# Patient Record
Sex: Female | Born: 1965
Health system: Southern US, Community
[De-identification: ages and names within clinical notes are randomized; demographics above are authoritative.]

## PROBLEM LIST (undated history)

## (undated) HISTORY — PX: TONSILLECTOMY: SUR1361

---

## 2017-11-25 ENCOUNTER — Emergency Department (HOSPITAL_BASED_OUTPATIENT_CLINIC_OR_DEPARTMENT_OTHER): Payer: 59

## 2017-11-25 ENCOUNTER — Encounter (HOSPITAL_BASED_OUTPATIENT_CLINIC_OR_DEPARTMENT_OTHER): Payer: Self-pay | Admitting: Emergency Medicine

## 2017-11-25 ENCOUNTER — Emergency Department (HOSPITAL_BASED_OUTPATIENT_CLINIC_OR_DEPARTMENT_OTHER)
Admission: EM | Admit: 2017-11-25 | Discharge: 2017-11-26 | Disposition: A | Discharge status: Home / Self Care | Payer: 59 | Attending: Emergency Medicine | Admitting: Emergency Medicine

## 2017-11-25 ENCOUNTER — Other Ambulatory Visit: Payer: Self-pay

## 2017-11-25 DIAGNOSIS — D259 Leiomyoma of uterus, unspecified: Secondary | ICD-10-CM | POA: Insufficient documentation

## 2017-11-25 DIAGNOSIS — Z91040 Latex allergy status: Secondary | ICD-10-CM | POA: Insufficient documentation

## 2017-11-25 DIAGNOSIS — R1010 Upper abdominal pain, unspecified: Secondary | ICD-10-CM | POA: Diagnosis not present

## 2017-11-25 DIAGNOSIS — K5901 Slow transit constipation: Secondary | ICD-10-CM

## 2017-11-25 DIAGNOSIS — R1031 Right lower quadrant pain: Secondary | ICD-10-CM | POA: Diagnosis not present

## 2017-11-25 DIAGNOSIS — R1084 Generalized abdominal pain: Secondary | ICD-10-CM | POA: Diagnosis present

## 2017-11-25 DIAGNOSIS — D219 Benign neoplasm of connective and other soft tissue, unspecified: Secondary | ICD-10-CM

## 2017-11-25 LAB — CBC WITH DIFFERENTIAL/PLATELET
BASOS ABS: 0 10*3/uL (ref 0.0–0.1)
Basophils Relative: 1 %
Eosinophils Absolute: 0 10*3/uL (ref 0.0–0.7)
Eosinophils Relative: 1 %
HEMATOCRIT: 43.2 % (ref 36.0–46.0)
Hemoglobin: 14.6 g/dL (ref 12.0–15.0)
LYMPHS ABS: 1.7 10*3/uL (ref 0.7–4.0)
Lymphocytes Relative: 38 %
MCH: 32.4 pg (ref 26.0–34.0)
MCHC: 33.8 g/dL (ref 30.0–36.0)
MCV: 95.8 fL (ref 78.0–100.0)
Monocytes Absolute: 0.9 10*3/uL (ref 0.1–1.0)
Monocytes Relative: 19 %
NEUTROS ABS: 1.8 10*3/uL (ref 1.7–7.7)
Neutrophils Relative %: 41 %
Platelets: 166 10*3/uL (ref 150–400)
RBC: 4.51 MIL/uL (ref 3.87–5.11)
RDW: 12.6 % (ref 11.5–15.5)
WBC: 4.4 10*3/uL (ref 4.0–10.5)

## 2017-11-25 LAB — URINALYSIS, ROUTINE W REFLEX MICROSCOPIC
BILIRUBIN URINE: NEGATIVE
Glucose, UA: NEGATIVE mg/dL
HGB URINE DIPSTICK: NEGATIVE
KETONES UR: NEGATIVE mg/dL
Leukocytes, UA: NEGATIVE
Nitrite: NEGATIVE
PROTEIN: NEGATIVE mg/dL
Specific Gravity, Urine: <1.005 — ABNORMAL LOW (ref 1.005–1.030)
pH: 7 (ref 5.0–8.0)

## 2017-11-25 LAB — COMPREHENSIVE METABOLIC PANEL
ALK PHOS: 49 U/L (ref 38–126)
ALT: 23 U/L (ref 0–44)
AST: 27 U/L (ref 15–41)
Albumin: 3.6 g/dL (ref 3.5–5.0)
Anion gap: 11 (ref 5–15)
BILIRUBIN TOTAL: 0.5 mg/dL (ref 0.3–1.2)
BUN: 19 mg/dL (ref 6–20)
CALCIUM: 9.1 mg/dL (ref 8.9–10.3)
CO2: 30 mmol/L (ref 22–32)
CREATININE: 0.91 mg/dL (ref 0.44–1.00)
Chloride: 100 mmol/L (ref 98–111)
GFR calc Af Amer: >60 mL/min (ref >60)
GFR calc non Af Amer: >60 mL/min (ref >60)
Glucose, Bld: 104 mg/dL — ABNORMAL HIGH (ref 70–99)
Potassium: 3.6 mmol/L (ref 3.5–5.1)
Sodium: 141 mmol/L (ref 135–145)
TOTAL PROTEIN: 6.2 g/dL — AB (ref 6.5–8.1)

## 2017-11-25 LAB — LIPASE, BLOOD: LIPASE: 43 U/L (ref 11–51)

## 2017-11-25 MED ORDER — ONDANSETRON HCL 4 MG/2ML IJ SOLN
4.0000 mg | Freq: Once | INTRAMUSCULAR | Status: AC
  Administered 2017-11-25: 4 mg via INTRAVENOUS
  Filled 2017-11-25: qty 2

## 2017-11-25 NOTE — ED Triage Notes (Signed)
PT presents with c/o abdominal pain and fever since Thursday. PT did e visit and was told to come to ER e visit assessment showed RLQ pain, pt reports nausea.

## 2017-11-25 NOTE — ED Provider Notes (Signed)
[facility] EMERGENCY DEPARTMENT Provider Note   CSN: [id number] Arrival date & time: 11/25/17  1832     History   Chief Complaint Chief Complaint  Patient presents with  . Abdominal Pain    HPI Joyce Austin is a 52 y.o. female.  HPI   Joyce Austin is a 52 year old female with no significant past medical history who presents to the emergency department for evaluation of generalized abdominal pain, fevers, bloating, nausea.  She reports her symptoms started 3 days ago.  States that her abdomen feels generally tight.  Pain is about a 4/10 in severity and constant.  Seems to be worsened with eating any kind of food.  She has been belching and passing more gas recently.  Took some ibuprofen which did not seem to help her symptoms much.  She also has had intermittent fevers at home measuring up to 101.54F yesterday evening.  She has had nausea, no vomiting.  Feels as if her abdomen is bloated.  She has had a prior C-section, no other abdominal surgeries.  She is a marathon runner, has had a 4 pounds of unintentional weight loss over the past 2 months, but believes this is related to running longer distances.  She has night sweats, but attributes this to menopause.  She denies dysuria, urinary frequency, hematuria, flank pain, back pain, diarrhea, vaginal bleeding, vaginal discharge, constipation, chest pain, shortness of breath, lightheadedness or syncope.  No close contacts with similar symptoms.  Last bowel movement was earlier today and normal.  History reviewed. No pertinent past medical history.  There are no active problems to display for this patient.   Past Surgical History:  Procedure Laterality Date  . CESAREAN SECTION    . TONSILLECTOMY       OB History   None      Home Medications    Prior to Admission medications   Not on File    Family History No family history on file.  Social History Social History   Tobacco Use  . Smoking status: Never Smoker    . Smokeless tobacco: Never Used  Substance Use Topics  . Alcohol use: Never    Frequency: Never  . Drug use: Never     Allergies   Dairy aid [lactase]; Latex; and Ultram [tramadol hcl]   Review of Systems Review of Systems  Constitutional: Positive for chills, fever and unexpected weight change.  Eyes: Negative for visual disturbance.  Respiratory: Negative for shortness of breath.   Cardiovascular: Negative for chest pain.  Gastrointestinal: Positive for abdominal pain. Negative for abdominal distention, blood in stool, constipation, diarrhea, nausea and vomiting.  Genitourinary: Negative for difficulty urinating, dysuria, flank pain, frequency, hematuria, vaginal bleeding and vaginal discharge.  Musculoskeletal: Negative for back pain and gait problem.  Skin: Negative for rash.  Neurological: Negative for weakness, light-headedness and numbness.  Psychiatric/Behavioral: Negative for agitation.     Physical Exam Updated Vital Signs BP 115/71 (BP Location: Right Arm)   Pulse 63   Temp 98.7 F (37.1 C) (Oral)   Resp 16   Ht 5\' 1"  (1.549 m)   Wt 44.9 kg   SpO2 100%   BMI 18.71 kg/m   Physical Exam  Constitutional: She is oriented to person, place, and time. She appears well-developed and well-nourished. No distress.  No acute distress, nontoxic-appearing.  HENT:  Head: Normocephalic and atraumatic.  Mouth/Throat: Oropharynx is clear and moist.  Eyes: Pupils are equal, round, and reactive to light. Conjunctivae are normal. Right  eye exhibits no discharge. Left eye exhibits no discharge.  Neck: Normal range of motion. Neck supple.  Cardiovascular: Normal rate, regular rhythm and intact distal pulses.  No murmur heard. Pulmonary/Chest: Effort normal and breath sounds normal. No stridor. No respiratory distress. She has no wheezes. She has no rales.  Abdominal:  Abdomen soft and nondistended.  Acutely tender to palpation in the right lower quadrant as well as in the  left lower quadrant.  No guarding or rigidity.  Negative Murphy sign.  No CVA tenderness.  Musculoskeletal: Normal range of motion.  Neurological: She is alert and oriented to person, place, and time. Coordination normal.  Skin: Skin is warm and dry. Capillary refill takes less than 2 seconds. She is not diaphoretic.  Psychiatric: She has a normal mood and affect. Her behavior is normal.  Nursing note and vitals reviewed.    ED Treatments / Results  Labs (all labs ordered are listed, but only abnormal results are displayed) Labs Reviewed  COMPREHENSIVE METABOLIC PANEL - Abnormal; Notable for the following components:      Result Value   Glucose, Bld 104 (*)    Total Protein 6.2 (*)    All other components within normal limits  URINALYSIS, ROUTINE W REFLEX MICROSCOPIC - Abnormal; Notable for the following components:   Specific Gravity, Urine <1.005 (*)    All other components within normal limits  CBC WITH DIFFERENTIAL/PLATELET  LIPASE, BLOOD    EKG None  Radiology No results found.  Procedures Procedures (including critical care time)  Medications Ordered in ED Medications  ondansetron (ZOFRAN) injection 4 mg (4 mg Intravenous Given 11/25/17 2209)     Initial Impression / Assessment and Plan / ED Course  I have reviewed the triage vital signs and the nursing notes.  Pertinent labs & imaging results that were available during my care of the patient were reviewed by me and considered in my medical decision making (see chart for details).    Patient presents with generalized abdominal pain, nausea, bloating, intermittent fevers over the past few days.  On exam she is afebrile and nontoxic-appearing.  Acutely tender to palpation in both the right and left lower quadrants.  No peritoneal signs and I have no concern for acute surgical abdomen.  Lab work reviewed.  No leukocytosis.  CMP without any major lecture light abnormalities, creatinine within normal and liver enzymes  within normal.  UA without evidence of infection.  Lipase negative.  Overall lab work is reassuring, but patient does report measured temperature and has significant tenderness on exam.  Will get CT scan abdomen/pelvis for further evaluation.  Signout given at shift change to Dr. Christy Gentles for disposition once CT returns.  Final Clinical Impressions(s) / ED Diagnoses   Final diagnoses:  None    ED Discharge Orders    None       Bernarda Caffey 11/26/17 0014    Ripley Fraise, MD 11/26/17 [phone]

## 2017-11-26 DIAGNOSIS — R1031 Right lower quadrant pain: Secondary | ICD-10-CM | POA: Diagnosis not present

## 2017-11-26 MED ORDER — ONDANSETRON 8 MG PO TBDP: ORAL_TABLET | ORAL | 0 refills | Status: DC

## 2017-11-26 MED ORDER — IOPAMIDOL (ISOVUE-300) INJECTION 61%
100.0000 mL | Freq: Once | INTRAVENOUS | Status: AC | PRN
  Administered 2017-11-26: 100 mL via INTRAVENOUS

## 2017-11-26 NOTE — ED Provider Notes (Signed)
CT results discussed with patient.  She will start Colace at home.  Prescribed Zofran for nausea.  She was informed of fibroids, and likely need for outpatient imaging.  She will follow-up with her new PCP about this.  She feels comfortable for discharge, advised her she should be able to run a marathon next week   Ripley Fraise, MD 11/26/17 [phone]

## 2017-11-26 NOTE — ED Notes (Signed)
Pt and family understood dc material. NAD noted. Scripts sent electronically

## 2017-11-26 NOTE — ED Notes (Signed)
Patient transported to CT 

## 2018-01-15 DIAGNOSIS — M79604 Pain in right leg: Secondary | ICD-10-CM | POA: Diagnosis not present

## 2018-01-22 DIAGNOSIS — M79604 Pain in right leg: Secondary | ICD-10-CM | POA: Diagnosis not present

## 2018-05-16 ENCOUNTER — Other Ambulatory Visit (HOSPITAL_COMMUNITY)
Admission: RE | Admit: 2018-05-16 | Discharge: 2018-05-16 | Disposition: A | Discharge status: Home / Self Care | Payer: 59 | Source: Ambulatory Visit | Attending: Family Medicine | Admitting: Family Medicine

## 2018-05-16 ENCOUNTER — Other Ambulatory Visit: Payer: Self-pay | Admitting: Family Medicine

## 2018-05-16 DIAGNOSIS — Z1159 Encounter for screening for other viral diseases: Secondary | ICD-10-CM | POA: Diagnosis not present

## 2018-05-16 DIAGNOSIS — Z124 Encounter for screening for malignant neoplasm of cervix: Secondary | ICD-10-CM | POA: Insufficient documentation

## 2018-05-16 DIAGNOSIS — Z0000 Encounter for general adult medical examination without abnormal findings: Secondary | ICD-10-CM | POA: Diagnosis not present

## 2018-05-16 DIAGNOSIS — Z13220 Encounter for screening for lipoid disorders: Secondary | ICD-10-CM | POA: Diagnosis not present

## 2018-05-18 LAB — CYTOLOGY - PAP
Diagnosis: NEGATIVE
HPV (WINDOPATH): NOT DETECTED

## 2018-05-21 ENCOUNTER — Other Ambulatory Visit: Payer: Self-pay | Admitting: Family Medicine

## 2018-05-21 DIAGNOSIS — Z1231 Encounter for screening mammogram for malignant neoplasm of breast: Secondary | ICD-10-CM

## 2018-06-21 ENCOUNTER — Ambulatory Visit: Payer: 59

## 2018-07-18 ENCOUNTER — Ambulatory Visit: Payer: 59

## 2018-10-04 ENCOUNTER — Ambulatory Visit
Admission: RE | Admit: 2018-10-04 | Discharge: 2018-10-04 | Disposition: A | Discharge status: Home / Self Care | Payer: 59 | Source: Ambulatory Visit | Attending: Family Medicine | Admitting: Family Medicine

## 2018-10-04 DIAGNOSIS — Z1231 Encounter for screening mammogram for malignant neoplasm of breast: Secondary | ICD-10-CM

## 2019-06-04 ENCOUNTER — Other Ambulatory Visit: Payer: Self-pay | Admitting: Family Medicine

## 2019-06-04 DIAGNOSIS — Z1231 Encounter for screening mammogram for malignant neoplasm of breast: Secondary | ICD-10-CM

## 2019-06-04 DIAGNOSIS — E2839 Other primary ovarian failure: Secondary | ICD-10-CM

## 2019-10-07 ENCOUNTER — Ambulatory Visit
Admission: RE | Admit: 2019-10-07 | Discharge: 2019-10-07 | Disposition: A | Discharge status: Home / Self Care | Payer: 59 | Source: Ambulatory Visit | Attending: Family Medicine | Admitting: Family Medicine

## 2019-10-07 ENCOUNTER — Other Ambulatory Visit: Payer: Self-pay

## 2019-10-07 DIAGNOSIS — Z1231 Encounter for screening mammogram for malignant neoplasm of breast: Secondary | ICD-10-CM

## 2019-10-07 DIAGNOSIS — E2839 Other primary ovarian failure: Secondary | ICD-10-CM

## 2020-08-28 ENCOUNTER — Other Ambulatory Visit: Payer: Self-pay | Admitting: Family Medicine

## 2020-08-28 DIAGNOSIS — Z1231 Encounter for screening mammogram for malignant neoplasm of breast: Secondary | ICD-10-CM

## 2020-09-15 ENCOUNTER — Other Ambulatory Visit: Payer: Self-pay

## 2020-09-15 ENCOUNTER — Ambulatory Visit
Admission: RE | Admit: 2020-09-15 | Discharge: 2020-09-15 | Disposition: A | Discharge status: Home / Self Care | Payer: 59 | Source: Ambulatory Visit

## 2020-09-15 DIAGNOSIS — Z1231 Encounter for screening mammogram for malignant neoplasm of breast: Secondary | ICD-10-CM

## 2021-04-01 NOTE — Telephone Encounter (Signed)
Thanks Carrie! 

## 2021-04-15 ENCOUNTER — Other Ambulatory Visit: Payer: Self-pay

## 2021-04-15 ENCOUNTER — Encounter: Payer: Self-pay | Admitting: Allergy

## 2021-04-15 ENCOUNTER — Ambulatory Visit (INDEPENDENT_AMBULATORY_CARE_PROVIDER_SITE_OTHER): Payer: 59 | Admitting: Allergy

## 2021-04-15 VITALS — BP 112/62 | HR 60 | Temp 98.4°F | Resp 16 | Ht 61.5 in | Wt 107.0 lb

## 2021-04-15 DIAGNOSIS — Z91038 Other insect allergy status: Secondary | ICD-10-CM | POA: Insufficient documentation

## 2021-04-15 DIAGNOSIS — T781XXD Other adverse food reactions, not elsewhere classified, subsequent encounter: Secondary | ICD-10-CM

## 2021-04-15 DIAGNOSIS — T7840XA Allergy, unspecified, initial encounter: Secondary | ICD-10-CM | POA: Insufficient documentation

## 2021-04-15 DIAGNOSIS — T783XXA Angioneurotic edema, initial encounter: Secondary | ICD-10-CM | POA: Insufficient documentation

## 2021-04-15 DIAGNOSIS — T783XXD Angioneurotic edema, subsequent encounter: Secondary | ICD-10-CM

## 2021-04-15 DIAGNOSIS — J3089 Other allergic rhinitis: Secondary | ICD-10-CM | POA: Diagnosis not present

## 2021-04-15 DIAGNOSIS — T7840XD Allergy, unspecified, subsequent encounter: Secondary | ICD-10-CM

## 2021-04-15 NOTE — Assessment & Plan Note (Signed)
Dairy causes bloating gas, diarrhea and fatigue.  Today's skin prick testing was negative to milk and casein.  Will double check via bloodwork.  Continue to avoid dairy for now.

## 2021-04-15 NOTE — Patient Instructions (Addendum)
Today's skin testing showed: Positive to grass, trees, dust mites. Borderline to mold.   Negative to select foods.  Results given.  Swelling: Not sure what's causing it. Keep track of episodes and take pictures. Get bloodwork We are ordering labs, so please allow 1-2 weeks for the results to come back. With the newly implemented Cures Act, the labs might be visible to you at the same time that they become visible to me. However, I will not address the results until all of the results are back, so please be patient.  In the meantime, continue recommendations in your patient instructions, including avoidance measures (if applicable), until you hear from me. At first sign of symptoms:  Start loratadine 10mg  twice a day. If symptoms are not controlled or causes drowsiness let us know. Start pepcid (famotidine) 20mg  twice a day.  Avoid the following potential triggers: alcohol, tight clothing, NSAIDs, hot showers and getting overheated.  Bee stings: Continue to avoid. For mild symptoms you can take over the counter antihistamines such as Benadryl and monitor symptoms closely. If symptoms worsen or if you have severe symptoms including breathing issues, throat closure, significant swelling, whole body hives, severe diarrhea and vomiting, lightheadedness then inject epinephrine and seek immediate medical care afterwards. Action plan given. Get bloodwork.  Food: Continue to avoid dairy products.  Environmental allergies Start environmental control measures as below. Use over the counter antihistamines such as Claritin (loratadine), Allegra (fexofenadine), or Xyzal (levocetirizine) daily as needed. May take twice a day during allergy flares. May switch antihistamines every few months.  Follow up in 4 months or sooner if needed.    Reducing Pollen Exposure Pollen seasons: trees (spring), grass (summer) and ragweed/weeds (fall). Keep windows closed in your home and car to lower pollen  exposure.  Install air conditioning in the bedroom and throughout the house if possible.  Avoid going out in dry windy days - especially early morning. Pollen counts are highest between 5 - 10 AM and on dry, hot and windy days.  Save outside activities for late afternoon or after a heavy rain, when pollen levels are lower.  Avoid mowing of grass if you have grass pollen allergy. Be aware that pollen can also be transported indoors on people and pets.  Dry your clothes in an automatic dryer rather than hanging them outside where they might collect pollen.  Rinse hair and eyes before bedtime. Control of House Dust Mite Allergen Dust mite allergens are a common trigger of allergy and asthma symptoms. While they can be found throughout the house, these microscopic creatures thrive in warm, humid environments such as bedding, upholstered furniture and carpeting. Because so much time is spent in the bedroom, it is essential to reduce mite levels there.  Encase pillows, mattresses, and box springs in special allergen-proof fabric covers or airtight, zippered plastic covers.  Bedding should be washed weekly in hot water (130 F) and dried in a hot dryer. Allergen-proof covers are available for comforters and pillows that cant be regularly washed.  Wash the allergy-proof covers every few months. Minimize clutter in the bedroom. Keep pets out of the bedroom.  Keep humidity less than 50% by using a dehumidifier or air conditioning. You can buy a humidity measuring device called a hygrometer to monitor this.  If possible, replace carpets with hardwood, linoleum, or washable area rugs. If that's not possible, vacuum frequently with a vacuum that has a HEPA filter. Remove all upholstered furniture and non-washable window drapes from the bedroom. Remove  all non-washable stuffed toys from the bedroom.  Wash stuffed toys weekly.  Mold Control Mold and fungi can grow on a variety of surfaces provided certain  temperature and moisture conditions exist.  Outdoor molds grow on plants, decaying vegetation and soil. The major outdoor mold, Alternaria and Cladosporium, are found in very high numbers during hot and dry conditions. Generally, a late summer - fall peak is seen for common outdoor fungal spores. Rain will temporarily lower outdoor mold spore count, but counts rise rapidly when the rainy period ends. The most important indoor molds are Aspergillus and Penicillium. Dark, humid and poorly ventilated basements are ideal sites for mold growth. The next most common sites of mold growth are the bathroom and the kitchen. Outdoor (Seasonal) Mold Control Use air conditioning and keep windows closed. Avoid exposure to decaying vegetation. Avoid leaf raking. Avoid grain handling. Consider wearing a face mask if working in moldy areas.  Indoor (Perennial) Mold Control  Maintain humidity below 50%. Get rid of mold growth on hard surfaces with water, detergent and, if necessary, 5% bleach (do not mix with other cleaners). Then dry the area completely. If mold covers an area more than 10 square feet, consider hiring an indoor environmental professional. For clothing, washing with soap and water is best. If moldy items cannot be cleaned and dried, throw them away. Remove sources e.g. contaminated carpets. Repair and seal leaking roofs or pipes. Using dehumidifiers in damp basements may be helpful, but empty the water and clean units regularly to prevent mildew from forming. All rooms, especially basements, bathrooms and kitchens, require ventilation and cleaning to deter mold and mildew growth. Avoid carpeting on concrete or damp floors, and storing items in damp areas.

## 2021-04-15 NOTE — Assessment & Plan Note (Deleted)
4 episod

## 2021-04-15 NOTE — Assessment & Plan Note (Signed)
2012 had large localized swelling with whole body hives. No prior work up. Patient carries and Epipen. She is an avid outdoor runner.  Continue to avoid.  For mild symptoms you can take over the counter antihistamines such as Benadryl and monitor symptoms closely. If symptoms worsen or if you have severe symptoms including breathing issues, throat closure, significant swelling, whole body hives, severe diarrhea and vomiting, lightheadedness then inject epinephrine and seek immediate medical care afterwards.  Action plan given.  Get bloodwork.

## 2021-04-15 NOTE — Progress Notes (Signed)
New Patient Note  RE: Joyce Austin MRN: [id number] DOB: 04/03/1965 Date of Office Visit: 04/15/2021  Consult requested by: No ref. provider found Primary care provider: Kathyrn Lass, MD  Chief Complaint: Allergic Reaction (Possibly to dairy but not sure )  History of Present Illness: I had the pleasure of seeing Joyce Austin for initial evaluation at the [facility] on 04/15/2021. She is a 56 y.o. female, who is self-referred here for the evaluation of allergic reactions.  Swelling started about 1 year ago. Mainly occurs on her face. Describes them as itching. Individual swelling episodes last about 3 days. No ecchymosis upon resolution. Associated symptoms include: none.  Frequency of episodes: patient had 4 episodes since it started.  First episode occurred in January 2022. Patient was at an [place]. She had [place] with naan and some type of soup. Patient came home and walked her dog. A few hours after the meal noted the facial swelling. Patient has never been at [employer]. She had sensitivity to dairy in the past and she is not sure if the food had dairy or not. The restaurant reassured her there was no dairy.  Dairy usually causes bloating, gas, diarrhea, fatigue.   The second episode occurred 1 hour after eating chocolate from [place] which apparently was dairy free.  The third episode occurred 5 minutes after eating a smaller bite of the [place] chocolate. This occurred in July. She had cocoa since then with no issues.   Fourth episode occurred earlier this month. She was at a friend's house for lunch and had corned beef, black eyed peas, pickled beets. Took the dog out and came home and had swelling.  Patient runs outdoors daily with no issues. Patient also walks the dog daily outdoors with no issues. She typically does not eat meat due to difficulty with digesting it.  She takes Claritin on a daily basis due to environmental allergies.  She thinks she took her antihistamines on the above days as she has a pillbox and is regimented about taking it.   Denies coming into excessive dust.   Suspected triggers are unknown. Denies any fevers, chills, changes in medications, personal care products or recent infections. She has tried the following therapies: benadryl with some benefit. Systemic steroids no.  Previous work up includes: none. Previous history of swelling: no. Family history of angioedema: no. Patient is up to date with the following cancer screening tests: physical exam, mammogram, colonoscopy, mammogram. Ace-inhibitor use: no  Dietary History: patient has been eating other foods including peanut, treenuts, sesame, fish, soy, wheat, limited meats, fruits and vegetables.  No recent tick bites.  She reports reading labels and avoiding dairy and beef in diet completely.  Patient is pretty much vegan.  Reviewed images on the phone - significant periorbital swelling noted bilaterally.  Assessment and Plan: Joyce Austin is a 56 y.o. female with: Angio-edema 4 episodes of facial edema in the past year. No specific triggers noted. Concerned about possible food (dairy) allergies or other triggers.  Today's skin prick testing showed: Positive to grass, trees, dust mites. Borderline to mold.  Negative to select foods including dairy and casein.  Based on clinical history not sure what's causing it. Keep track of episodes and take pictures. Get bloodwork At first sign of symptoms:  Start loratadine 10mg  twice a day (patient can't take zyrtec) If symptoms are not controlled or causes drowsiness let us know. Start Pepcid (famotidine) 20mg  twice a day.  Avoid  the following potential triggers: alcohol, tight clothing, NSAIDs, hot showers and getting overheated.  Other allergic rhinitis Takes Claritin daily for rhinitis symptoms. 1 dog at home. Today's skin prick testing showed: Positive to grass, trees, dust mites. Borderline  to mold.  Start environmental control measures as below. Use over the counter antihistamines such as Claritin (loratadine), Allegra (fexofenadine), or Xyzal (levocetirizine) daily as needed. May take twice a day during allergy flares. May switch antihistamines every few months.  Other adverse food reactions, not elsewhere classified, subsequent encounter Dairy causes bloating gas, diarrhea and fatigue. Today's skin prick testing was negative to milk and casein. Will double check via bloodwork. Continue to avoid dairy for now.   Hymenoptera allergy 2012 had large localized swelling with whole body hives. No prior work up. Patient carries and Epipen. She is an avid outdoor runner. Continue to avoid. For mild symptoms you can take over the counter antihistamines such as Benadryl and monitor symptoms closely. If symptoms worsen or if you have severe symptoms including breathing issues, throat closure, significant swelling, whole body hives, severe diarrhea and vomiting, lightheadedness then inject epinephrine and seek immediate medical care afterwards. Action plan given. Get bloodwork.  Return in about 4 months (around 08/13/2021).  No orders of the defined types were placed in this encounter.  Lab Orders         Alpha-Gal Panel         ANA w/Reflex         C1 Esterase Inhibitor         C1 esterase inhibitor, functional         C3 and C4         CBC with Differential/Platelet         Complement component c1q         Comprehensive metabolic panel         C-reactive protein         Sedimentation rate         Tryptase         Allergen Hymenoptera Panel         IgE Milk w/ Component Reflex         Protein Electrophoresis, Urine Rflx.         Protein electrophoresis, serum         Thyroid Cascade Profile      Other allergy screening: Asthma: no Rhino conjunctivitis:  Takes Claritin daily for sneezing - pollen, dust and dander tends to flare symptoms. No prior allergy testing.    Medication allergy: no Hymenoptera allergy: yes Stung by wasp at age 19 with no issues. In 2012 patient got stung on her toe - large localized swelling and whole body hives. Patient has Epipen for this but never had to use it. No prior work up.  Urticaria: no Eczema:no History of recurrent infections suggestive of immunodeficency: no  Diagnostics: Skin Testing: Environmental allergy panel and select foods. Positive to grass, trees, dust mites. Borderline to mold.   Negative to select foods. Results discussed with patient/family.  Airborne Adult Perc - 04/15/21 1511     Time Antigen Placed 1510    Allergen Manufacturer Lavella Hammock    Location Back    Number of Test 59    1. Control-Buffer 50% Glycerol Negative    2. Control-Histamine 1 mg/ml 2+    3. Albumin saline Negative    4. Everest Negative    5. [place] Negative    6. Johnson Negative    7. [facility]  Blue Negative    8. Meadow Fescue 2+    9. Perennial Rye Negative    10. Sweet Vernal Negative    11. Timothy Negative    12. Cocklebur Negative    13. Burweed Marshelder Negative    14. Ragweed, short 4+    15. Ragweed, Giant 4+    16. Plantain,  English Negative    17. Lamb's Quarters Negative    18. Sheep Sorrell Negative    19. Rough Pigweed Negative    20. Marsh Elder, Rough 4+    21. Mugwort, Common Negative    22. Ash mix Negative    23. Birch mix Negative    24. Beech American Negative    25. Box, Elder Negative    26. Cedar, red 2+    27. Cottonwood, [place] Negative    28. Elm mix Negative    29. Hickory Negative    30. Maple mix Negative    31. Oak, [place] mix Negative    32. Pecan Pollen Negative    33. Pine mix Negative    34. Sycamore Eastern Negative    35. [place], Black Pollen Negative    36. Alternaria alternata Negative    37. Cladosporium Herbarum Negative    38. Aspergillus mix Negative    39. Penicillium mix Negative    40. Bipolaris sorokiniana (Helminthosporium) Negative    41.  Drechslera spicifera (Curvularia) Negative    42. Mucor plumbeus Negative    43. Fusarium moniliforme Negative    44. Aureobasidium pullulans (pullulara) Negative    45. Rhizopus oryzae Negative    46. Botrytis cinera --   +/-   47. Epicoccum nigrum Negative    48. Phoma betae Negative    49. Candida Albicans Negative    50. Trichophyton mentagrophytes Negative    51. Mite, D Farinae  5,000 AU/ml Negative    52. Mite, D Pteronyssinus  5,000 AU/ml 2+    53. Cat Hair 10,000 BAU/ml Negative    54.  Dog Epithelia Negative    55. Mixed Feathers Negative    56. Horse Epithelia Negative    57. Cockroach, German Negative    58. Mouse Negative    59. Tobacco Leaf Negative             Food Adult Perc - 04/15/21 1500     Time Antigen Placed 1510    Allergen Manufacturer Lavella Hammock    Location Back    Number of allergen test 11    1. Peanut Negative    2. Soybean Negative    3. Wheat Negative    4. Sesame Negative    5. Milk, cow Negative    6. Egg White, Chicken Negative    7. Casein Negative    8. Shellfish Mix Negative    9. Fish Mix Negative    40. Beef Negative    64. Chocolate/Cacao bean Negative             Past Medical History: Patient Active Problem List   Diagnosis Date Noted   Allergic reaction 04/15/2021   Angio-edema 04/15/2021   Hymenoptera allergy 04/15/2021   Other adverse food reactions, not elsewhere classified, subsequent encounter 04/15/2021   Other allergic rhinitis 04/15/2021   History reviewed. No pertinent past medical history. Past Surgical History: Past Surgical History:  Procedure Laterality Date   CESAREAN SECTION     TONSILLECTOMY     Medication List:  Current Outpatient Medications  Medication Sig Dispense Refill  EPINEPHrine 0.3 mg/0.3 mL IJ SOAJ injection Inject 0.3 mg into the muscle as needed for anaphylaxis.     loratadine (CLARITIN) 10 MG tablet Take 10 mg by mouth daily.     No current facility-administered medications for  this visit.   Allergies: Allergies  Allergen Reactions   Dairy Aid [Tilactase]    Latex    Ultram [Tramadol Hcl]    Wasp Venom    Wool Alcohol [Lanolin]    Social History: Social History   Socioeconomic History   Marital status: Married    Spouse name: Not on file   Number of children: Not on file   Years of education: Not on file   Highest education level: Not on file  Occupational History   Not on file  Tobacco Use   Smoking status: Never    Passive exposure: Never   Smokeless tobacco: Never  Vaping Use   Vaping Use: Never used  Substance and Sexual Activity   Alcohol use: Never   Drug use: Never   Sexual activity: Not on file  Other Topics Concern   Not on file  Social History Narrative   Not on file   Social Determinants of Health   Financial Resource Strain: Not on file  Food Insecurity: Not on file  Transportation Needs: Not on file  Physical Activity: Not on file  Stress: Not on file  Social Connections: Not on file   Lives in a 2 year house. Smoking: denies Occupation: runner  Programme researcher, broadcasting/film/video HistoryFreight forwarder in the house: no Charity fundraiser in the family room: no Carpet in the bedroom: no Heating:  gas and electric Cooling: central Pet: yes 1 dog  x 2 yrs  Family History: Family History  Problem Relation Age of Onset   Allergic rhinitis Neg Hx    Angioedema Neg Hx    Asthma Neg Hx    Atopy Neg Hx    Eczema Neg Hx    Immunodeficiency Neg Hx    Urticaria Neg Hx    Review of Systems  Constitutional:  Negative for appetite change, chills, fever and unexpected weight change.  HENT:  Negative for congestion and rhinorrhea.        Facial swelling  Eyes:  Negative for itching.  Respiratory:  Negative for cough, chest tightness, shortness of breath and wheezing.   Cardiovascular:  Negative for chest pain.  Gastrointestinal:  Negative for abdominal pain.  Genitourinary:  Negative for difficulty urinating.  Skin:  Negative for rash.   Allergic/Immunologic: Positive for environmental allergies.  Neurological:  Negative for headaches.   Objective: BP 112/62    Pulse 60    Temp 98.4 F (36.9 C) (Temporal)    Resp 16    Ht 5' 1.5" (1.562 m)    Wt 107 lb (48.5 kg)    SpO2 99%    BMI 19.89 kg/m  Body mass index is 19.89 kg/m. Physical Exam Vitals and nursing note reviewed.  Constitutional:      Appearance: Normal appearance. She is well-developed.  HENT:     Head: Normocephalic and atraumatic.     Right Ear: Tympanic membrane and external ear normal.     Left Ear: Tympanic membrane and external ear normal.     Nose: Nose normal.     Mouth/Throat:     Mouth: Mucous membranes are moist.     Pharynx: Oropharynx is clear.  Eyes:     Conjunctiva/sclera: Conjunctivae normal.  Cardiovascular:     Rate and Rhythm: Normal  rate and regular rhythm.     Heart sounds: Normal heart sounds. No murmur heard.   No friction rub. No gallop.  Pulmonary:     Effort: Pulmonary effort is normal.     Breath sounds: Normal breath sounds. No wheezing, rhonchi or rales.  Musculoskeletal:     Cervical back: Neck supple.  Skin:    General: Skin is warm.     Findings: No rash.  Neurological:     Mental Status: She is alert and oriented to person, place, and time.  Psychiatric:        Behavior: Behavior normal.  The plan was reviewed with the patient/family, and all questions/concerned were addressed.  It was my pleasure to see Joyce Austin today and participate in her care. Please feel free to contact me with any questions or concerns.  Sincerely,  Rexene Alberts, DO Allergy & Immunology  [facility] office: [place] office: [phone]

## 2021-04-15 NOTE — Assessment & Plan Note (Addendum)
Takes Claritin daily for rhinitis symptoms. 1 dog at home.  Today's skin prick testing showed: Positive to grass, trees, dust mites. Borderline to mold.   Start environmental control measures as below.  Use over the counter antihistamines such as Claritin (loratadine), Allegra (fexofenadine), or Xyzal (levocetirizine) daily as needed. May take twice a day during allergy flares. May switch antihistamines every few months.

## 2021-04-15 NOTE — Assessment & Plan Note (Signed)
4 episodes of facial edema in the past year. No specific triggers noted. Concerned about possible food (dairy) allergies or other triggers.   Today's skin prick testing showed: Positive to grass, trees, dust mites. Borderline to mold.  Negative to select foods including dairy and casein.   Based on clinical history not sure what's causing it.  Keep track of episodes and take pictures.  Get bloodwork  At first sign of symptoms:   Start loratadine 10mg  twice a day (patient can't take zyrtec)  If symptoms are not controlled or causes drowsiness let us know.  Start Pepcid (famotidine) 20mg  twice a day.   Avoid the following potential triggers: alcohol, tight clothing, NSAIDs, hot showers and getting overheated.

## 2021-04-23 LAB — ALLERGEN HYMENOPTERA PANEL
Bumblebee: <0.1 kU/L
Honeybee IgE: <0.1 kU/L
Hornet, White Face, IgE: 0.16 kU/L — AB
Hornet, Yellow, IgE: <0.1 kU/L
Paper Wasp IgE: 0.47 kU/L — AB
Yellow Jacket, IgE: 0.23 kU/L — AB

## 2021-04-23 LAB — C1 ESTERASE INHIBITOR: C1INH SerPl-mCnc: 28 mg/dL (ref 21–39)

## 2021-04-23 LAB — PROTEIN ELECTROPHORESIS, SERUM
A/G Ratio: 1.5 (ref 0.7–1.7)
Albumin ELP: 4.3 g/dL (ref 2.9–4.4)
Alpha 1: 0.2 g/dL (ref 0.0–0.4)
Alpha 2: 0.6 g/dL (ref 0.4–1.0)
Beta: 1 g/dL (ref 0.7–1.3)
Gamma Globulin: 1.1 g/dL (ref 0.4–1.8)
Globulin, Total: 2.9 g/dL (ref 2.2–3.9)

## 2021-04-23 LAB — COMPREHENSIVE METABOLIC PANEL
ALT: 29 IU/L (ref 0–32)
AST: 28 IU/L (ref 0–40)
Albumin/Globulin Ratio: 2 (ref 1.2–2.2)
Albumin: 4.8 g/dL (ref 3.8–4.9)
Alkaline Phosphatase: 84 IU/L (ref 44–121)
BUN/Creatinine Ratio: 23 (ref 9–23)
BUN: 25 mg/dL — ABNORMAL HIGH (ref 6–24)
Bilirubin Total: 0.6 mg/dL (ref 0.0–1.2)
CO2: 25 mmol/L (ref 20–29)
Calcium: 10 mg/dL (ref 8.7–10.2)
Chloride: 100 mmol/L (ref 96–106)
Creatinine, Ser: 1.08 mg/dL — ABNORMAL HIGH (ref 0.57–1.00)
Globulin, Total: 2.4 g/dL (ref 1.5–4.5)
Glucose: 69 mg/dL — ABNORMAL LOW (ref 70–99)
Potassium: 4.4 mmol/L (ref 3.5–5.2)
Sodium: 141 mmol/L (ref 134–144)
Total Protein: 7.2 g/dL (ref 6.0–8.5)
eGFR: 60 mL/min/{1.73_m2} (ref >59)

## 2021-04-23 LAB — ANA W/REFLEX: Anti Nuclear Antibody (ANA): NEGATIVE

## 2021-04-23 LAB — CBC WITH DIFFERENTIAL/PLATELET
Basophils Absolute: 0.1 10*3/uL (ref 0.0–0.2)
Basos: 1 %
EOS (ABSOLUTE): 0 10*3/uL (ref 0.0–0.4)
Eos: 0 %
Hematocrit: 42.4 % (ref 34.0–46.6)
Hemoglobin: 14.2 g/dL (ref 11.1–15.9)
Immature Grans (Abs): 0 10*3/uL (ref 0.0–0.1)
Immature Granulocytes: 0 %
Lymphocytes Absolute: 1.7 10*3/uL (ref 0.7–3.1)
Lymphs: 18 %
MCH: 32.3 pg (ref 26.6–33.0)
MCHC: 33.5 g/dL (ref 31.5–35.7)
MCV: 97 fL (ref 79–97)
Monocytes Absolute: 0.7 10*3/uL (ref 0.1–0.9)
Monocytes: 7 %
Neutrophils Absolute: 6.5 10*3/uL (ref 1.4–7.0)
Neutrophils: 74 %
Platelets: 220 10*3/uL (ref 150–450)
RBC: 4.39 x10E6/uL (ref 3.77–5.28)
RDW: 11.3 % — ABNORMAL LOW (ref 11.7–15.4)
WBC: 9 10*3/uL (ref 3.4–10.8)

## 2021-04-23 LAB — SEDIMENTATION RATE: Sed Rate: 2 mm/hr (ref 0–40)

## 2021-04-23 LAB — C3 AND C4
Complement C3, Serum: 107 mg/dL (ref 82–167)
Complement C4, Serum: 26 mg/dL (ref 12–38)

## 2021-04-23 LAB — PROTEIN ELECTROPHORESIS, URINE REFLEX
Albumin ELP, Urine: 26 %
Alpha-1-Globulin, U: 6.4 %
Alpha-2-Globulin, U: 13.7 %
Beta Globulin, U: 38 %
Gamma Globulin, U: 15.9 %
Protein, Ur: 13.9 mg/dL

## 2021-04-23 LAB — ALPHA-GAL PANEL
Allergen Lamb IgE: <0.1 kU/L
Beef IgE: 0.18 kU/L — AB
IgE (Immunoglobulin E), Serum: 95 IU/mL (ref 6–495)
O215-IgE Alpha-Gal: 0.39 kU/L — AB
Pork IgE: <0.1 kU/L

## 2021-04-23 LAB — THYROID CASCADE PROFILE: TSH: 1.53 u[IU]/mL (ref 0.450–4.500)

## 2021-04-23 LAB — COMPLEMENT COMPONENT C1Q: Complement C1Q: 15.2 mg/dL (ref 10.3–20.5)

## 2021-04-23 LAB — C1 ESTERASE INHIBITOR, FUNCTIONAL: C1INH Functional/C1INH Total MFr SerPl: 89 %mean normal

## 2021-04-23 LAB — C-REACTIVE PROTEIN: CRP: <1 mg/L (ref 0–10)

## 2021-04-23 LAB — TRYPTASE: Tryptase: 6.4 ug/L (ref 2.2–13.2)

## 2021-04-23 LAB — IGE MILK W/ COMPONENT REFLEX: F002-IgE Milk: <0.1 kU/L

## 2021-05-08 ENCOUNTER — Encounter: Payer: Self-pay | Admitting: Allergy

## 2021-07-01 ENCOUNTER — Other Ambulatory Visit: Payer: Self-pay | Admitting: Family Medicine

## 2021-07-01 DIAGNOSIS — Z1231 Encounter for screening mammogram for malignant neoplasm of breast: Secondary | ICD-10-CM

## 2021-07-01 DIAGNOSIS — M8580 Other specified disorders of bone density and structure, unspecified site: Secondary | ICD-10-CM

## 2021-08-17 ENCOUNTER — Ambulatory Visit: Payer: 59 | Admitting: Allergy

## 2021-12-24 ENCOUNTER — Ambulatory Visit
Admission: RE | Admit: 2021-12-24 | Discharge: 2021-12-24 | Disposition: A | Discharge status: Home / Self Care | Payer: 59 | Source: Ambulatory Visit | Attending: Family Medicine | Admitting: Family Medicine

## 2021-12-24 DIAGNOSIS — M8580 Other specified disorders of bone density and structure, unspecified site: Secondary | ICD-10-CM

## 2021-12-24 DIAGNOSIS — Z1231 Encounter for screening mammogram for malignant neoplasm of breast: Secondary | ICD-10-CM

## 2022-10-14 ENCOUNTER — Other Ambulatory Visit: Payer: Self-pay | Admitting: Family Medicine

## 2022-10-14 DIAGNOSIS — M79672 Pain in left foot: Secondary | ICD-10-CM

## 2022-10-18 ENCOUNTER — Ambulatory Visit
Admission: RE | Admit: 2022-10-18 | Discharge: 2022-10-18 | Disposition: A | Discharge status: Home / Self Care | Payer: 59 | Source: Ambulatory Visit | Attending: Family Medicine | Admitting: Family Medicine

## 2022-10-18 DIAGNOSIS — M79672 Pain in left foot: Secondary | ICD-10-CM

## 2023-01-09 IMAGING — MG MM DIGITAL SCREENING BILAT W/ TOMO AND CAD
6 of 10 series · 6 of 30 positions shown · non-contrast
Comparison: Previous exam(s).

CLINICAL DATA: Screening.

EXAM:
DIGITAL SCREENING BILATERAL MAMMOGRAM WITH TOMOSYNTHESIS AND CAD
TECHNIQUE: Bilateral screening digital craniocaudal and mediolateral oblique
mammograms were obtained. Bilateral screening digital breast
tomosynthesis was performed. The images were evaluated with
computer-aided detection.

[R MLO synth-2D]
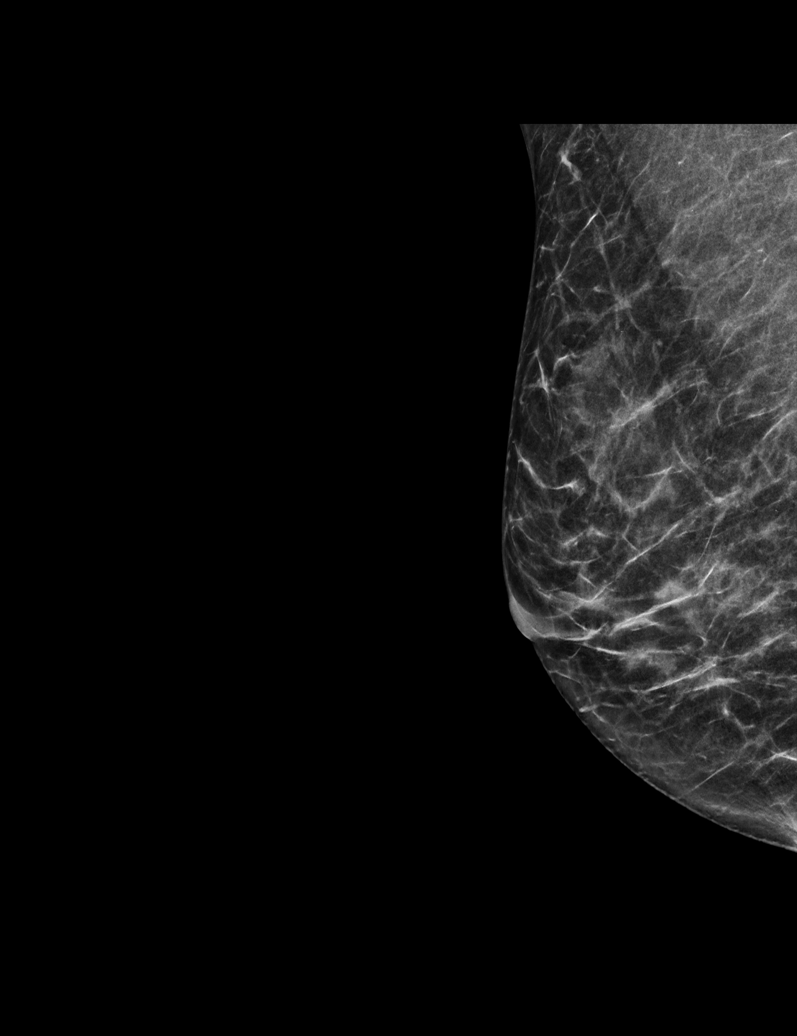

[L MLO synth-2D]
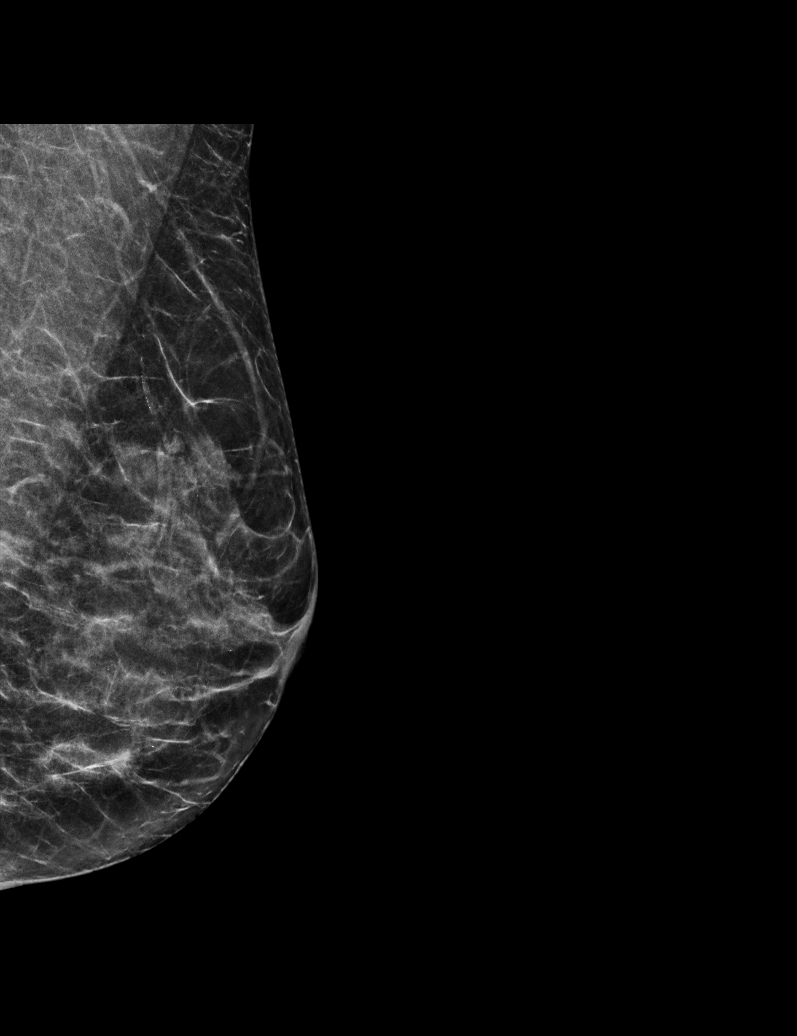

[L CC synth-2D (1 of 2)]
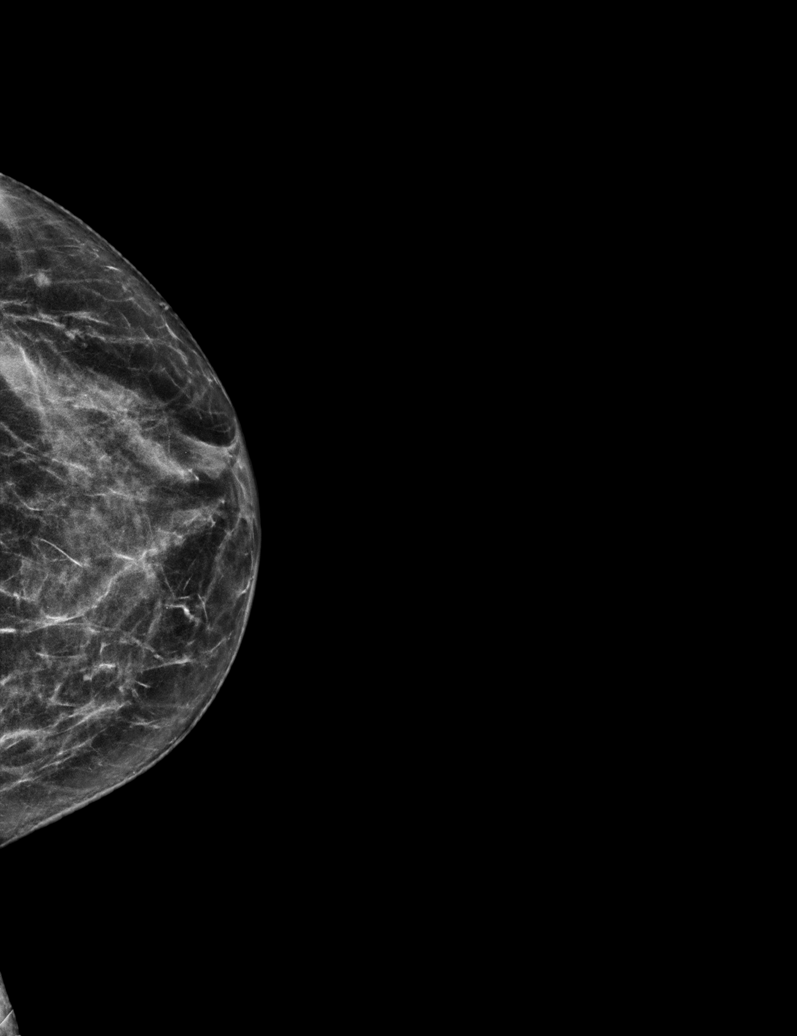

[L CC synth-2D (2 of 2)]
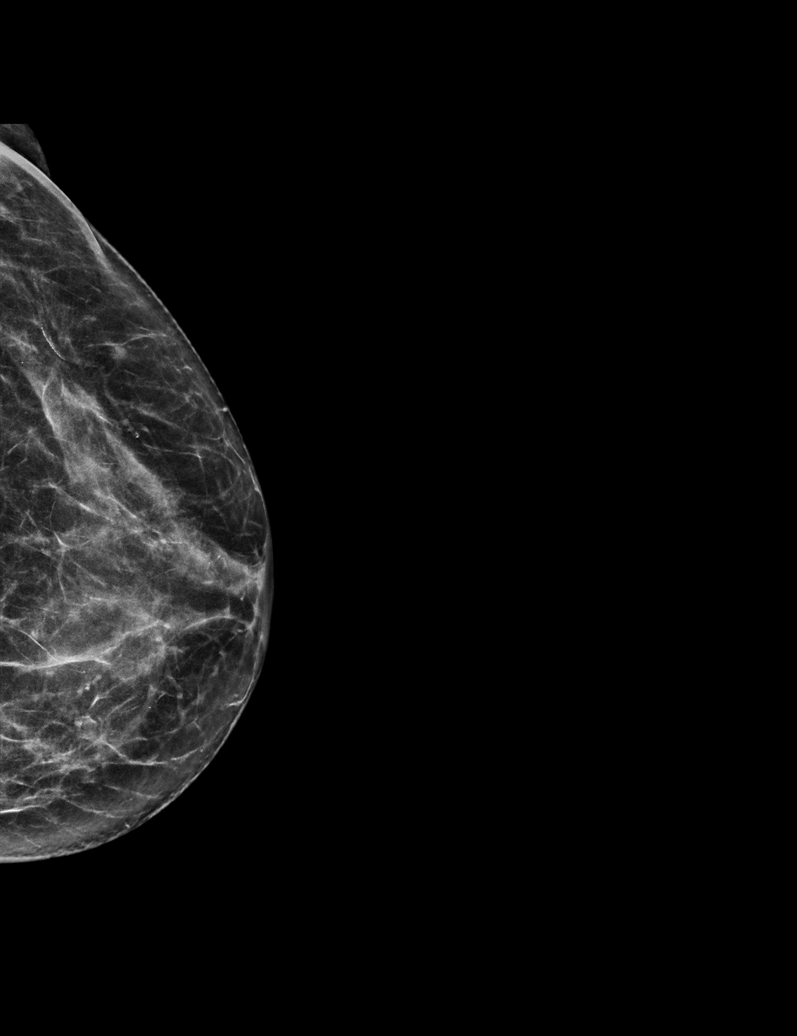

[R CC synth-2D]
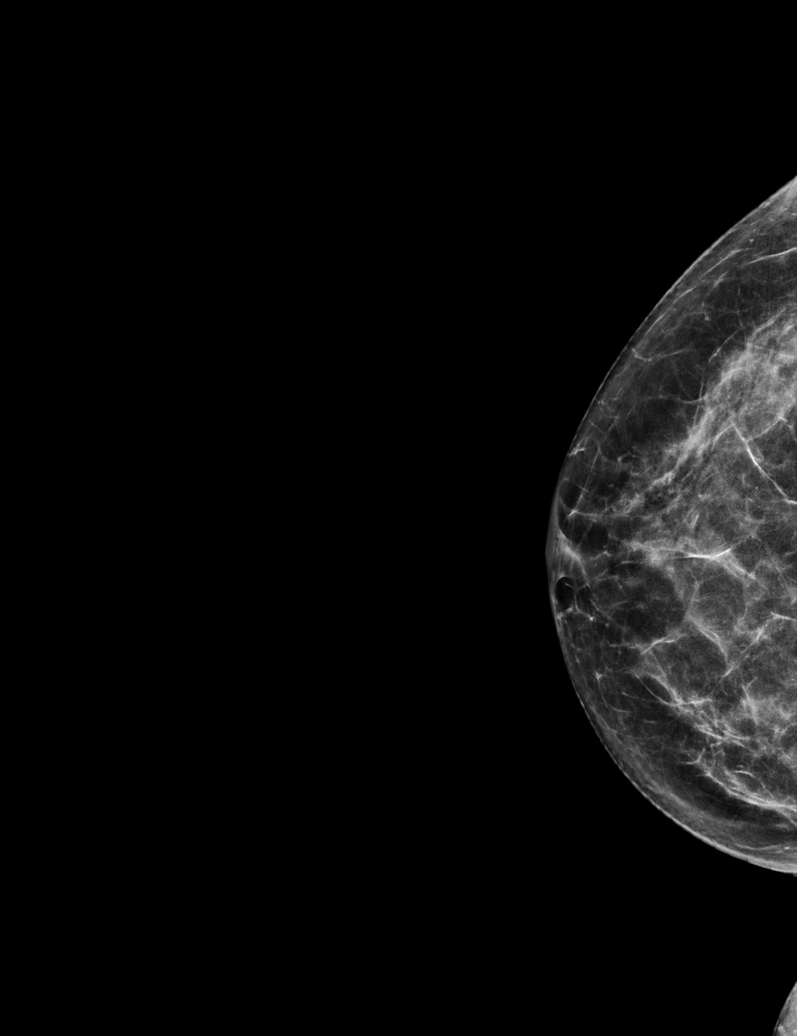

[R CC tomo · tomo slice 29/57.0]
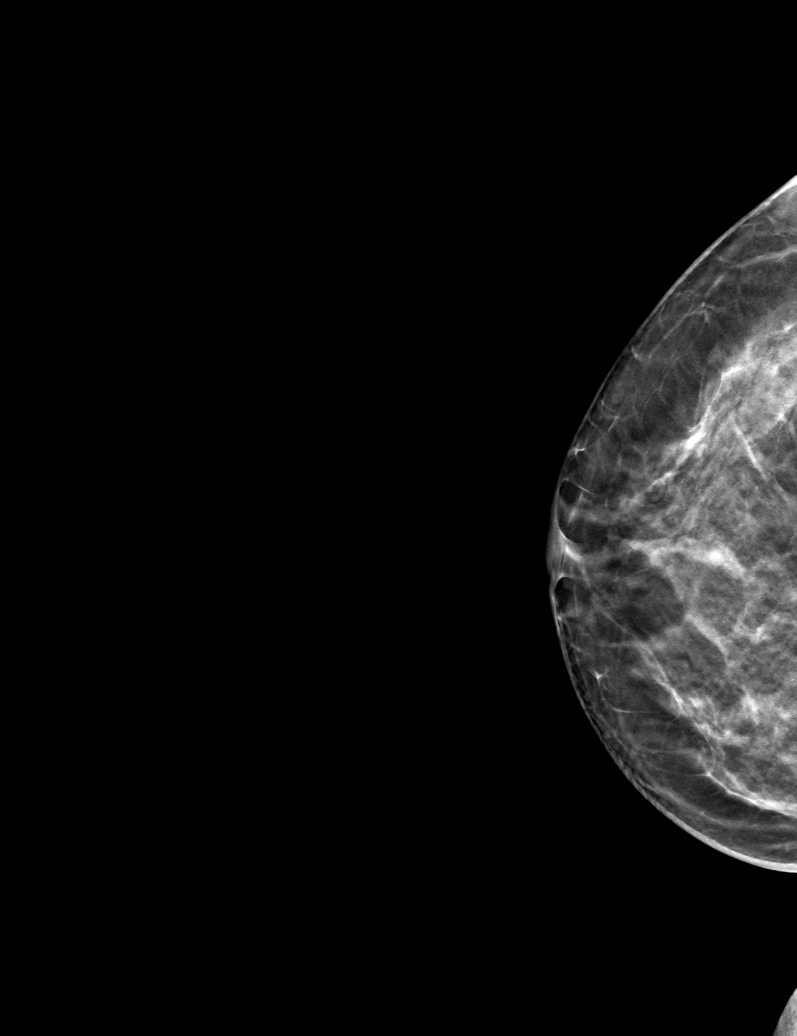

[6 of 30 positions shown; findings below may reference images not displayed]

ACR Breast Density Category c: The breast tissue is heterogeneously
dense, which may obscure small masses.
FINDINGS: There are no findings suspicious for malignancy.
IMPRESSION: No mammographic evidence of malignancy. A result letter of this
screening mammogram will be mailed directly to the patient.

RECOMMENDATION:
Screening mammogram in one year. (Code:Q3-W-BC3)

BI-RADS CATEGORY  1: Negative.

## 2023-07-07 ENCOUNTER — Other Ambulatory Visit: Payer: Self-pay | Admitting: Family Medicine

## 2023-07-07 DIAGNOSIS — Z1231 Encounter for screening mammogram for malignant neoplasm of breast: Secondary | ICD-10-CM

## 2023-07-07 DIAGNOSIS — M8580 Other specified disorders of bone density and structure, unspecified site: Secondary | ICD-10-CM

## 2023-09-07 ENCOUNTER — Ambulatory Visit

## 2023-09-14 ENCOUNTER — Ambulatory Visit
Admission: RE | Admit: 2023-09-14 | Discharge: 2023-09-14 | Disposition: A | Discharge status: Home / Self Care | Source: Ambulatory Visit | Attending: Family Medicine | Admitting: Family Medicine

## 2023-09-14 DIAGNOSIS — Z1231 Encounter for screening mammogram for malignant neoplasm of breast: Secondary | ICD-10-CM

## 2023-11-23 ENCOUNTER — Inpatient Hospital Stay (HOSPITAL_BASED_OUTPATIENT_CLINIC_OR_DEPARTMENT_OTHER): Admission: RE | Admit: 2023-11-23 | Source: Ambulatory Visit

## 2023-12-28 ENCOUNTER — Ambulatory Visit (HOSPITAL_BASED_OUTPATIENT_CLINIC_OR_DEPARTMENT_OTHER)
Admission: RE | Admit: 2023-12-28 | Discharge: 2023-12-28 | Disposition: A | Discharge status: Home / Self Care | Source: Ambulatory Visit | Attending: Family Medicine | Admitting: Family Medicine

## 2023-12-28 DIAGNOSIS — M8580 Other specified disorders of bone density and structure, unspecified site: Secondary | ICD-10-CM | POA: Diagnosis present

## 2024-03-18 ENCOUNTER — Other Ambulatory Visit
